# Patient Record
Sex: Male | Born: 1985 | Race: White | Hispanic: No | Marital: Married | Smoking: Former smoker
Health system: Southern US, Community
[De-identification: ages and names within clinical notes are randomized; demographics above are authoritative.]

## PROBLEM LIST (undated history)

## (undated) DIAGNOSIS — R519 Headache, unspecified: Secondary | ICD-10-CM

## (undated) HISTORY — DX: Headache, unspecified: R51.9

---
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
## 2008-05-09 ENCOUNTER — Emergency Department (HOSPITAL_COMMUNITY): Admission: EM | Admit: 2008-05-09 | Discharge: 2008-05-09 | Payer: Self-pay | Admitting: Emergency Medicine

## 2010-10-01 NOTE — Procedures (Signed)
NAME:  Colin Benson, Colin Benson              ACCOUNT NO.:  [id number]   MEDICAL RECORD NO.:  [id number]          PATIENT TYPE:  OUT   LOCATION:  SLEEP CENTER                 FACILITY:  [facility]   PHYSICIAN:  Clinton D. Maple Hudson, M.D. DATE OF BIRTH:  04-02-1986   DATE OF ADMISSION:  12/18/2003  DATE OF DISCHARGE:  12/18/2003                              NOCTURNAL POLYSOMNOGRAM   REFERRING PHYSICIAN:  Meredith Staggers, M.D.   INDICATION FOR STUDY AND HISTORY:  1. Hypersomnia with sleep apnea.  2. Complaints of snoring.  3. Nonrestorative sleep.  4. Excessive daytime somnolence.   Epworth sleepiness score 14/24,  BMI 31, weight 235 pounds.   MEDICATIONS:  Risperdal, Zoloft.   SLEEP ARCHITECTURE:  Total sleep time 351 minutes for sleep efficiency of  92%.  Stage I was 2%; stage II, 78%; Stages III and IV 2%.  REM was 18% of  total sleep time.  Latency to sleep onset 19 minutes, latency to REM 64  minutes.  Awake after sleep onset 5 minutes.  Arousal index 34 times per  hour, mostly associated with respiratory events   RESPIRATORY DATA:  Moderate obstructive sleep apnea/hypopnea syndrome, RDI  29 per hour including 107 obstructive hypopneas, 65 obstructive apneas,  and  1 central apnea.  Events were associated with his habitual supine sleeping  position.  He did not spend significant time in other positions.  Split  study protocol was requested, but he did not generate enough events early  enough in the evening to permit application of the split protocol.  Suggest  return for CPAP titration.   OXYGEN DATA:  Very loud snoring with mild oxygen desaturation to Benson nadir  of83%.  Mean saturation through the night on room air was 97%.   CARDIAC DATA:  Sinus rhythm with no significant ectopic events.   MOVEMENT AND PARASOMNIA:  Occasional leg jerks with arousal, probably  insignificant.   IMPRESSION AND RECOMMENDATIONS:  1. Moderate obstructive sleep apnea/hypopnea syndrome, RDI 29 per hour,  with     desaturation  to 83% and normal     cardiac rhythm.  2. Consider return for CPAP titration or evaluation for alternative     therapies.                                   ______________________________                                Rennis Chris Maple Hudson, M.D.                                Diplomate, American Board of Sleep Medicine    CDY/MEDQ  D:  12/21/2003 13:14:33  T:  12/22/2003 23:50:53  Job:  [id number]

## 2011-02-18 LAB — CBC
MCV: 84.8 fL (ref 78.0–100.0)
Platelets: 385 10*3/uL (ref 150–400)
RDW: 13.1 % (ref 11.5–15.5)
WBC: 11.2 10*3/uL — ABNORMAL HIGH (ref 4.0–10.5)

## 2011-02-18 LAB — URINALYSIS, ROUTINE W REFLEX MICROSCOPIC
Ketones, ur: NEGATIVE mg/dL
Protein, ur: NEGATIVE mg/dL
Urobilinogen, UA: 0.2 mg/dL (ref 0.0–1.0)

## 2011-02-18 LAB — DIFFERENTIAL
Basophils Absolute: 0.1 10*3/uL (ref 0.0–0.1)
Eosinophils Relative: 1 % (ref 0–5)
Lymphocytes Relative: 26 % (ref 12–46)
Monocytes Absolute: 0.8 10*3/uL (ref 0.1–1.0)
Monocytes Relative: 7 % (ref 3–12)
Neutro Abs: 7.3 10*3/uL (ref 1.7–7.7)

## 2011-02-18 LAB — COMPREHENSIVE METABOLIC PANEL
AST: 24 U/L (ref 0–37)
Albumin: 4.1 g/dL (ref 3.5–5.2)
Chloride: 104 mEq/L (ref 96–112)
Creatinine, Ser: 1.08 mg/dL (ref 0.4–1.5)
GFR calc Af Amer: >60 mL/min (ref >60)
Potassium: 3.7 mEq/L (ref 3.5–5.1)
Total Bilirubin: 0.7 mg/dL (ref 0.3–1.2)
Total Protein: 7 g/dL (ref 6.0–8.3)

## 2011-08-08 ENCOUNTER — Ambulatory Visit: Payer: BC Managed Care – PPO | Admitting: Internal Medicine

## 2011-08-08 VITALS — BP 126/85 | HR 59 | Temp 98.5°F | Resp 16 | Ht 77.0 in | Wt 239.8 lb

## 2011-08-08 DIAGNOSIS — L253 Unspecified contact dermatitis due to other chemical products: Secondary | ICD-10-CM

## 2011-08-08 DIAGNOSIS — G4730 Sleep apnea, unspecified: Secondary | ICD-10-CM

## 2011-08-08 DIAGNOSIS — L259 Unspecified contact dermatitis, unspecified cause: Secondary | ICD-10-CM

## 2011-08-08 DIAGNOSIS — Z23 Encounter for immunization: Secondary | ICD-10-CM

## 2011-08-08 MED ORDER — TETANUS-DIPHTH-ACELL PERTUSSIS 5-2.5-18.5 LF-MCG/0.5 IM SUSP: 0.5000 mL | Freq: Once | INTRAMUSCULAR | Status: AC

## 2011-08-08 NOTE — Progress Notes (Signed)
  Subjective:    Patient ID: Colin Benson, male    DOB: 14-Aug-1985, 26 y.o.   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  He has washed the wound thoroughly and there is no pain, drainage, redness, or foreign body sensation.  He can walk without difficulty.  He is unsure of his tetanus status and would like a Tdap today.  He also complains of a slight rash just to the right of his lower lip where he states the condensation from his CPAP machine accumulates during the night.  He has tried Neosporin on the site without improvement or resolution of the redness.    Review of Systems  All other systems reviewed and are negative.       Objective:   Physical Exam  Vitals reviewed. Constitutional: He is oriented to person, place, and time. He appears well-developed and well-nourished.       overweight  HENT:  Head: Normocephalic.  Mouth/Throat: Oropharynx is clear and moist.  Eyes: Conjunctivae are normal.  Neck: Neck supple.  Cardiovascular: Normal rate, regular rhythm and normal heart sounds.   Pulmonary/Chest: Effort normal and breath sounds normal.  Abdominal: Soft.  Neurological: He is alert and oriented to person, place, and time.  Skin:       0.5 cm puncture wound on the bottom of his right foot just below his 4th toe.  No erythema or drainage, soft to touch though skin is think and callused. His right peri-oral area has mild papular lesions with erythema c/w irritant dermatitis  Psychiatric: He has a normal mood and affect. His behavior is normal.          Assessment & Plan:  Tdap for puncture wound which appears to be healing well.  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 Irritant dermatitis likely due to CPAP.  1% Hydrocortison cream over the counter BID for 1-2 weeks THEN STOP CREAM to avoid hyperpigmentation.  Use Vaseline as barrier to skin before bed after erythema and papules are gone.  Pt will  follow-up with [facility] his PCP.

## 2011-08-08 NOTE — Patient Instructions (Signed)
Keep right foot wound clean and dry until skin is well healed.  For the contact dermatitis apply hydrocortisone cream, available over the counter, for 1-2 weeks once or twice daily THEN STOP.  Use Vaseline as a barrier to the skin around your mouth to prevent red rash.  Return to your PCP or to our clinic if it doesn't resolve.

## 2013-07-02 ENCOUNTER — Encounter: Payer: Self-pay | Admitting: Physician Assistant

## 2013-07-02 ENCOUNTER — Ambulatory Visit: Payer: Self-pay

## 2013-07-02 ENCOUNTER — Ambulatory Visit: Payer: 59

## 2013-07-02 ENCOUNTER — Other Ambulatory Visit: Payer: Self-pay | Admitting: Physician Assistant

## 2013-07-02 ENCOUNTER — Ambulatory Visit (INDEPENDENT_AMBULATORY_CARE_PROVIDER_SITE_OTHER): Payer: 59 | Admitting: Family Medicine

## 2013-07-02 VITALS — BP 130/80 | HR 88 | Temp 98.5°F | Resp 16 | Ht 73.0 in | Wt 274.2 lb

## 2013-07-02 DIAGNOSIS — R0789 Other chest pain: Secondary | ICD-10-CM

## 2013-07-02 DIAGNOSIS — S20219A Contusion of unspecified front wall of thorax, initial encounter: Secondary | ICD-10-CM

## 2013-07-02 DIAGNOSIS — R1012 Left upper quadrant pain: Secondary | ICD-10-CM

## 2013-07-02 DIAGNOSIS — R071 Chest pain on breathing: Secondary | ICD-10-CM

## 2013-07-02 LAB — POCT CBC
Granulocyte percent: 65.3 %G (ref 37–80)
HCT, POC: 47.2 % (ref 43.5–53.7)
HEMOGLOBIN: 15.7 g/dL (ref 14.1–18.1)
Lymph, poc: 2.6 (ref 0.6–3.4)
MCH: 29.5 pg (ref 27–31.2)
MCHC: 33.3 g/dL (ref 31.8–35.4)
MCV: 88.7 fL (ref 80–97)
MID (cbc): 0.7 (ref 0–0.9)
MPV: 8.6 fL (ref 0–99.8)
PLATELET COUNT, POC: 390 10*3/uL (ref 142–424)
POC Granulocyte: 6.3 (ref 2–6.9)
POC LYMPH PERCENT: 27.1 %L (ref 10–50)
POC MID %: 7.6 %M (ref 0–12)
RBC: 5.32 M/uL (ref 4.69–6.13)
RDW, POC: 13.6 %
WBC: 9.7 10*3/uL (ref 4.6–10.2)

## 2013-07-02 MED ORDER — HYDROCODONE-ACETAMINOPHEN 5-325 MG PO TABS: 1.0000 | ORAL_TABLET | Freq: Four times a day (QID) | ORAL | Status: DC | PRN

## 2013-07-02 NOTE — Progress Notes (Signed)
Xray read and patient discussed with Eula Listenyan Dunn, PA-C. Agree with assessment and plan of care per his note.   CXR report noted:  FINDINGS:  The cardiac silhouette, mediastinal and hilar contours are normal.  The lungs are clear. No pleural effusion or pneumothorax. The ribs  are intact and the thoracic vertebral bodies appear normal.  IMPRESSION:  No acute cardiopulmonary findings and intact bony thorax

## 2013-07-02 NOTE — Progress Notes (Signed)
Subjective:    Patient ID: Colin Benson, male    DOB: 1986/01/06, 28 y.o.   MRN: [id number]  HPI Primary Physician: Leanor Rubenstein, MD  Chief Complaint: Sledding accident the previous evening  HPI: 28 y.o. male with history below presents with left sided chest wall pain. Patient got a running start and dove head first onto a wooden and metal rail sled. He landed on his chest. The injury occurred around 10 PM on 07/01/13. Upon injuring himself he immediately felt pain along the LUQ and left lower ribs. He states it hurts to take deep breaths. He had a difficult time sleeping the previous night secondary to pain. Pain is worse when laying down. He has taken ibuprofen 600 mg twice and 400 mg once without any relief of pain. No prior chest or abdominal injuries. He has had an inguinal hernia repair at age [age], but no abdominal surgeries.    No past medical history on file.   Home Meds: Prior to Admission medications   Medication Sig Start Date End Date Taking? Authorizing Provider  ibuprofen (ADVIL,MOTRIN) 200 MG tablet Take 200 mg by mouth every 6 (six) hours as needed.   Yes Historical Provider, MD    Allergies:  Allergies  Allergen Reactions  . Azithromycin Swelling     Swelling:  Mouth, throat  . Penicillins Hives and Other (See Comments)    Reaction: fever    History   Social History  . Marital Status: Married    Spouse Name: N/A    Number of Children: N/A  . Years of Education: N/A   Occupational History  . Not on file.   Social History Main Topics  . Smoking status: Former Smoker    Quit date: 08/07/2005  . Smokeless tobacco: Not on file  . Alcohol Use: Not on file  . Drug Use: Not on file  . Sexual Activity: Not on file   Other Topics Concern  . Not on file   Social History Narrative  . No narrative on file     Review of Systems  Constitutional: Negative for diaphoresis.  Respiratory: Negative for cough, shortness of breath and wheezing.     Cardiovascular: Positive for chest pain.  Gastrointestinal: Negative for nausea.  Skin: Negative for color change, pallor, rash and wound.       Negative for ecchymosis.   Neurological: Negative for dizziness, syncope, weakness and light-headedness.       Objective:   Physical Exam  Physical Exam: Blood pressure 130/80, pulse 88, temperature 98.5 F (36.9 C), temperature source Oral, resp. rate 16, height 6\' 1"  (1.854 m), weight 274 lb 3.2 oz (124.376 kg), SpO2 98.00%., Body mass index is 36.18 kg/(m^2). General: Well developed, well nourished, in no acute distress. Head: Normocephalic, atraumatic, eyes without discharge, sclera non-icteric, nares are without discharge.    Neck: Supple. Full ROM.  Lungs: Clear bilaterally to auscultation without wheezes, rales, or rhonchi. Breathing is unlabored. Heart: RRR with S1 S2. No murmurs, rubs, or gallops appreciated. Abdomen: Soft, non-distended with normoactive bowel sounds. TTP LUQ extending superiorly into ribs 11 and 12 and laterally to the midaxillary line. There is no ecchymosis present along the chest wall or abdomen. No hepatosplenomegaly. No rebound/guarding. No obvious abdominal masses. No CVA TTP.  Msk:  Strength and tone normal for age. Extremities/Skin: Warm and dry. No clubbing or cyanosis. No edema. No rashes or suspicious lesions. Neuro: Alert and oriented X 3. Moves all extremities spontaneously. Gait is normal. CNII-XII  grossly in tact. Psych:  Responds to questions appropriately with a normal affect.    3 view CXR:  [facility] reading (PRIMARY) by  Dr. Neva SeatGreene. Negative. No free air seen. No pneumothorax.   Labs: Results for orders placed in visit on 07/02/13  POCT CBC      Result Value Ref Range   WBC 9.7  4.6 - 10.2 K/uL   Lymph, poc 2.6  0.6 - 3.4   POC LYMPH PERCENT 27.1  10 - 50 %L   MID (cbc) 0.7  0 - 0.9   POC MID % 7.6  0 - 12 %M   POC Granulocyte 6.3  2 - 6.9   Granulocyte percent 65.3  37 - 80 %G   RBC 5.32   4.69 - 6.13 M/uL   Hemoglobin 15.7  14.1 - 18.1 g/dL   HCT, POC 16.147.2  09.643.5 - 53.7 %   MCV 88.7  80 - 97 fL   MCH, POC 29.5  27 - 31.2 pg   MCHC 33.3  31.8 - 35.4 g/dL   RDW, POC 04.513.6     Platelet Count, POC 390  142 - 424 K/uL   MPV 8.6  0 - 99.8 fL       Assessment & Plan:  28 year old male with chest wall contusion and LUQ pain -Norco 5/325 mg 1 po q 4-6 hours prn pain #30 no RF, SED -Recheck CBC 07/03/13 in the morning -Declines CT abdomen/pelvis today. Given that his injury occurred 18 hours ago and his hgb is 15.7 I feel this is probably ok at this time. He has been given RTC and ED precautions.  -If his hgb declines or if his pain persists he may need to have the above CT done -Discussed with Dr. Serita GrammesGreene   Jhon Mallozzi, MHS, PA-C [facility] [address] [place], [place] [address] [phone] [facility] 07/02/2013 3:06 PM

## 2013-07-03 ENCOUNTER — Ambulatory Visit (INDEPENDENT_AMBULATORY_CARE_PROVIDER_SITE_OTHER): Payer: 59 | Admitting: Physician Assistant

## 2013-07-03 VITALS — BP 118/78 | HR 71 | Temp 98.6°F | Resp 16 | Ht 73.0 in | Wt 274.0 lb

## 2013-07-03 DIAGNOSIS — R1012 Left upper quadrant pain: Secondary | ICD-10-CM

## 2013-07-03 DIAGNOSIS — R0789 Other chest pain: Secondary | ICD-10-CM

## 2013-07-03 DIAGNOSIS — R071 Chest pain on breathing: Secondary | ICD-10-CM

## 2013-07-03 LAB — POCT CBC
Granulocyte percent: 65.6 %G (ref 37–80)
HEMATOCRIT: 47.1 % (ref 43.5–53.7)
HEMOGLOBIN: 15.7 g/dL (ref 14.1–18.1)
LYMPH, POC: 2.2 (ref 0.6–3.4)
MCH: 29.6 pg (ref 27–31.2)
MCHC: 33.3 g/dL (ref 31.8–35.4)
MCV: 88.8 fL (ref 80–97)
MID (cbc): 0.6 (ref 0–0.9)
MPV: 7.9 fL (ref 0–99.8)
POC Granulocyte: 5.3 (ref 2–6.9)
POC LYMPH PERCENT: 26.7 %L (ref 10–50)
POC MID %: 7.7 % (ref 0–12)
Platelet Count, POC: 369 10*3/uL (ref 142–424)
RBC: 5.3 M/uL (ref 4.69–6.13)
RDW, POC: 13.7 %
WBC: 8.1 10*3/uL (ref 4.6–10.2)

## 2013-07-03 NOTE — Progress Notes (Signed)
   Subjective:    Patient ID: Colin Benson, male    DOB: 11-21-1985, 28 y.o.   MRN: [id number]  HPI   Mr. Colin Benson is a very pleasant 28 yr old male here for follow up on chest wall pain and LUQ pain.  Approx 2 days ago he injured his LUQ/chest while sledding.  Continues to have persistent pain.  Was evaluated here last night.  Some concern for intraabdominal injury given his level of LUQ tenderness.  H/H normal last night.  He is here to repaet that today.  Norco takes the edge of the pain but does not completely resolve.  He is not worsening.    Review of Systems  Constitutional: Negative for fever and chills.  HENT: Negative.   Respiratory: Negative for cough, shortness of breath and wheezing.   Cardiovascular: Positive for chest pain.  Gastrointestinal: Positive for abdominal pain.       Objective:   Physical Exam  Vitals reviewed. Constitutional: He is oriented to person, place, and time. He appears well-developed and well-nourished. No distress.  HENT:  Head: Normocephalic and atraumatic.  Eyes: Conjunctivae are normal. No scleral icterus.  Cardiovascular: Normal rate, regular rhythm and normal heart sounds.   Pulmonary/Chest: Effort normal and breath sounds normal. He has no wheezes. He has no rales.  Abdominal: Soft. There is tenderness in the left upper quadrant.    Neurological: He is alert and oriented to person, place, and time.  Skin: Skin is warm and dry.    Results for orders placed in visit on 07/03/13  POCT CBC      Result Value Ref Range   WBC 8.1  4.6 - 10.2 K/uL   Lymph, poc 2.2  0.6 - 3.4   POC LYMPH PERCENT 26.7  10 - 50 %L   MID (cbc) 0.6  0 - 0.9   POC MID % 7.7  0 - 12 %M   POC Granulocyte 5.3  2 - 6.9   Granulocyte percent 65.6  37 - 80 %G   RBC 5.30  4.69 - 6.13 M/uL   Hemoglobin 15.7  14.1 - 18.1 g/dL   HCT, POC 91.447.1  78.243.5 - 53.7 %   MCV 88.8  80 - 97 fL   MCH, POC 29.6  27 - 31.2 pg   MCHC 33.3  31.8 - 35.4 g/dL   RDW, POC 95.613.7     Platelet Count, POC 369  142 - 424 K/uL   MPV 7.9  0 - 99.8 fL       Assessment & Plan:  LUQ pain - Plan: POCT CBC  Chest wall pain - Plan: POCT CBC   Mr. Colin Benson is a very pleasant 28 yr old male here for f/u on LUQ pain.  He continues to have pain and tenderness in the LUQ and left lower ribs.  No ecchymosis.  H/H unchanged from yesterday, making intraabdominal bleeding less likely.  Continue supportive care and pain medication.  If worsening or not improving, would get CT abd to further visualize any injury.  Pt to call or RTC if worsening or not improving  E. Frances FurbishElizabeth Shamikia Benson MHS, PA-C [facility] 2/18/20153:08 PM

## 2013-07-08 ENCOUNTER — Ambulatory Visit
Admission: RE | Admit: 2013-07-08 | Discharge: 2013-07-08 | Disposition: A | Discharge status: Home / Self Care | Payer: 59 | Source: Ambulatory Visit | Attending: Emergency Medicine | Admitting: Emergency Medicine

## 2013-07-08 ENCOUNTER — Ambulatory Visit (INDEPENDENT_AMBULATORY_CARE_PROVIDER_SITE_OTHER): Payer: 59 | Admitting: Emergency Medicine

## 2013-07-08 VITALS — BP 132/90 | HR 79 | Temp 98.1°F | Resp 16 | Ht 73.0 in | Wt 274.0 lb

## 2013-07-08 DIAGNOSIS — R0789 Other chest pain: Secondary | ICD-10-CM

## 2013-07-08 DIAGNOSIS — R1012 Left upper quadrant pain: Secondary | ICD-10-CM

## 2013-07-08 DIAGNOSIS — S2239XA Fracture of one rib, unspecified side, initial encounter for closed fracture: Secondary | ICD-10-CM

## 2013-07-08 DIAGNOSIS — S20219A Contusion of unspecified front wall of thorax, initial encounter: Secondary | ICD-10-CM

## 2013-07-08 LAB — POCT CBC
GRANULOCYTE PERCENT: 65.3 % (ref 37–80)
HCT, POC: 48.1 % (ref 43.5–53.7)
Hemoglobin: 16 g/dL (ref 14.1–18.1)
Lymph, poc: 2.9 (ref 0.6–3.4)
MCH: 29.7 pg (ref 27–31.2)
MCHC: 33.3 g/dL (ref 31.8–35.4)
MCV: 89.4 fL (ref 80–97)
MID (CBC): 0.7 (ref 0–0.9)
MPV: 8.4 fL (ref 0–99.8)
PLATELET COUNT, POC: 408 10*3/uL (ref 142–424)
POC Granulocyte: 6.8 (ref 2–6.9)
POC LYMPH %: 27.9 % (ref 10–50)
POC MID %: 6.8 % (ref 0–12)
RBC: 5.38 M/uL (ref 4.69–6.13)
RDW, POC: 13.6 %
WBC: 10.4 10*3/uL — AB (ref 4.6–10.2)

## 2013-07-08 MED ORDER — IOHEXOL 300 MG/ML  SOLN
125.0000 mL | Freq: Once | INTRAMUSCULAR | Status: AC | PRN
  Administered 2013-07-08: 125 mL via INTRAVENOUS

## 2013-07-08 MED ORDER — IOHEXOL 300 MG/ML  SOLN
30.0000 mL | Freq: Once | INTRAMUSCULAR | Status: AC | PRN
  Administered 2013-07-08: 30 mL via ORAL

## 2013-07-08 MED ORDER — HYDROCODONE-ACETAMINOPHEN 5-325 MG PO TABS: 1.0000 | ORAL_TABLET | ORAL | Status: DC | PRN

## 2013-07-08 NOTE — Progress Notes (Signed)
[facility] [address], [place] [address] [phone]  Date:  07/08/2013   Name:  Colin LeakDavid A Benson   DOB:  [birth year]   MRN:  [id number]  PCP:  Leanor RubensteinSUN,VYVYAN Y, MD    Chief Complaint: Follow-up   History of Present Illness:  Colin Benson is a 28 y.o. very pleasant male patient who presents with the following:  Injured while sledding a week ago.  Seen here and had labs and xrays.  Apparently the patient refused CT scan.  Now to office with increased LUQ pain and tenderness.  Pain increases with activity, movement and breathing. No nausea or vomiting.  No stool change, cough, wheezing or shortness of breath. No hemoptysis or blood in stools.  No fever or chills. No improvement with over the counter medications or other home remedies. Denies other complaint or health concern today.   Patient Active Problem List   Diagnosis Date Noted  . Sleep apnea 08/08/2011    History reviewed. No pertinent past medical history.  History reviewed. No pertinent past surgical history.  History  Substance Use Topics  . Smoking status: Former Smoker    Quit date: 08/07/2005  . Smokeless tobacco: Not on file  . Alcohol Use: Not on file    History reviewed. No pertinent family history.  Allergies  Allergen Reactions  . Azithromycin Swelling     Swelling:  Mouth, throat  . Penicillins Hives and Other (See Comments)    Reaction: fever    Medication list has been reviewed and updated.  Current Outpatient Prescriptions on File Prior to Visit  Medication Sig Dispense Refill  . HYDROcodone-acetaminophen (NORCO/VICODIN) 5-325 MG per tablet Take 1 tablet by mouth every 6 (six) hours as needed for moderate pain.  30 tablet  0  . ibuprofen (ADVIL,MOTRIN) 200 MG tablet Take 200 mg by mouth every 6 (six) hours as needed.       Current Facility-Administered Medications on File Prior to Visit  Medication Dose Route Frequency Provider Last Rate Last Dose  . TDaP (BOOSTRIX)  injection 0.5 mL  0.5 mL Intramuscular Once Rickard PatienceSarah E Nelson, PA-C        Review of Systems:  As per HPI, otherwise negative.    Physical Examination: Filed Vitals:   07/08/13 1456  BP: 132/90  Pulse: 79  Temp: 98.1 F (36.7 C)  Resp: 16   Filed Vitals:   07/08/13 1456  Height: 6\' 1"  (1.854 m)  Weight: 274 lb (124.286 kg)   Body mass index is 36.16 kg/(m^2). Ideal Body Weight: Weight in (lb) to have BMI = 25: 189.1  GEN: WDWN, NAD, Non-toxic, A & O x 3 HEENT: Atraumatic, Normocephalic. Neck supple. No masses, No LAD. Ears and Nose: No external deformity. CV: RRR, No M/G/R. No JVD. No thrill. No extra heart sounds. PULM: CTA B, no wheezes, crackles, rhonchi. No retractions. No resp. distress. No accessory muscle use. ABD: S, LUQ tenderness, ND, decreased BS. No rebound. No HSM. EXTR: No c/c/e NEURO Normal gait.  PSYCH: Normally interactive. Conversant. Not depressed or anxious appearing.  Calm demeanor.    Assessment and Plan: LUQ tenderness possible spleen injury Fractured left ribs   Signed,  Phillips OdorJeffery Kattaleya Alia, MD

## 2013-07-08 NOTE — Patient Instructions (Signed)
Go to [facility] [address] [phone] for CT abd/pelvis

## 2014-12-19 IMAGING — CT CT ABD-PELV W/ CM
2 of 4 series · 17 of 46 positions shown, 19 images · IV contrast (30CC OMNI 300 & [ID] OMNI 300)
Comparison: DG CHEST 2V dated 07/02/2013; DG CHEST 1V dated
07/02/2013

CLINICAL DATA: Left upper quadrant abdominal pain.  Sliding injury.

EXAM:
CT ABDOMEN AND PELVIS WITH CONTRAST
TECHNIQUE: Multidetector CT imaging of the abdomen and pelvis was performed
using the standard protocol following bolus administration of
intravenous contrast.
CONTRAST:  30mL OMNIPAQUE IOHEXOL 300 MG/ML SOLN, 125mL OMNIPAQUE
IOHEXOL 300 MG/ML SOLN

[Series 2: abd/pelvis with · axial · 0.85mm/px · z∈[-394,+46]mm · 14 of 98 slices shown, 16 images]
[im 5/98  soft-tissue]
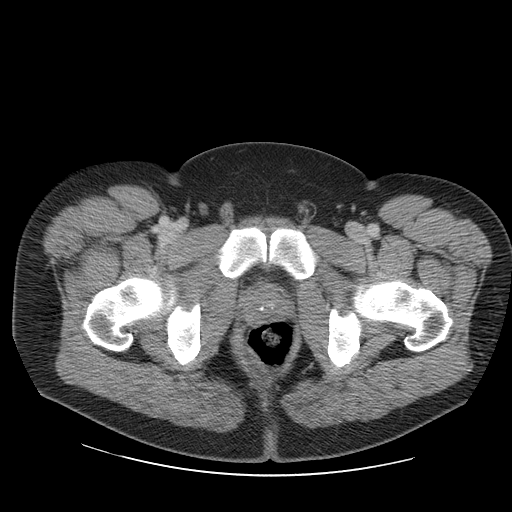
[im 5/98  bone]
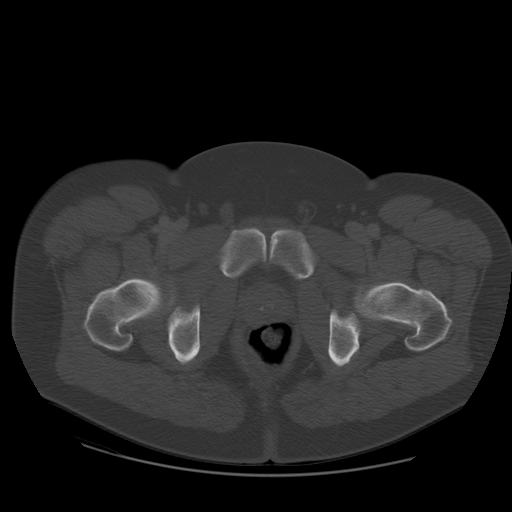
[im 13/98  soft-tissue]
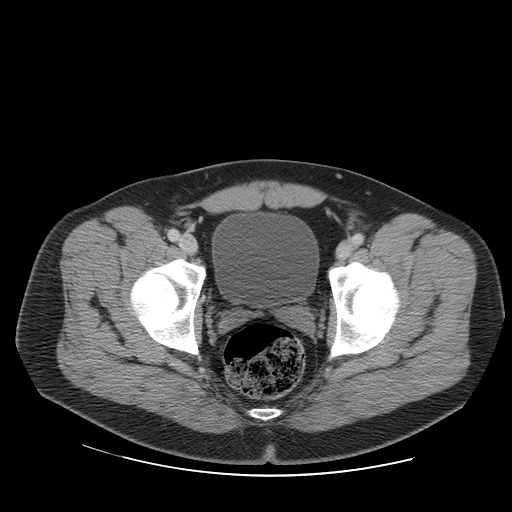
[im 17/98  soft-tissue]
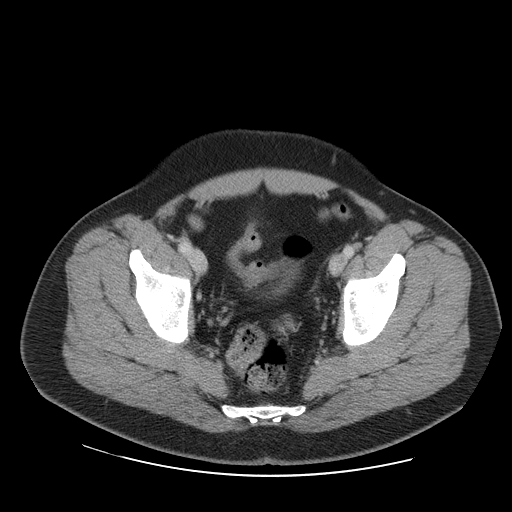
[im 26/98  soft-tissue]
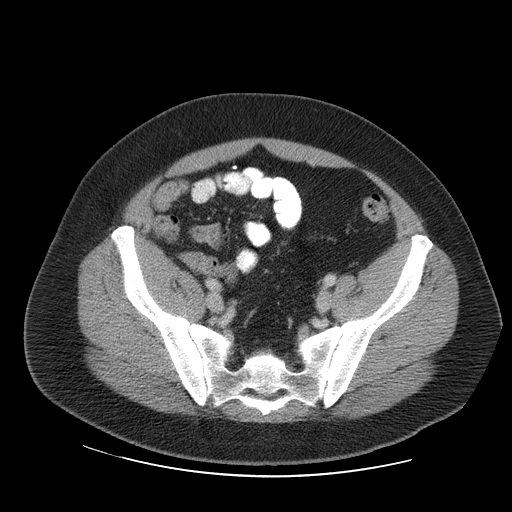
[im 34/98  soft-tissue]
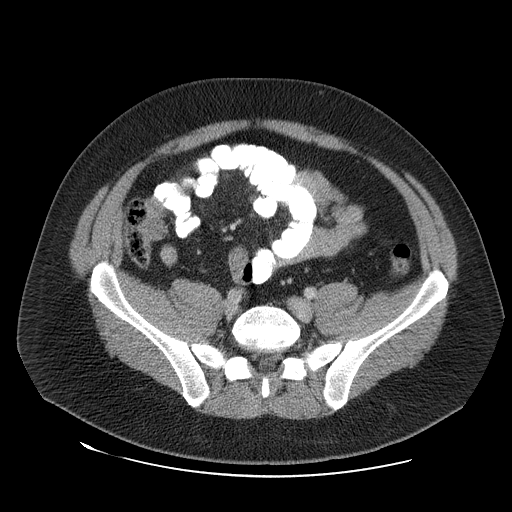
[im 38/98  soft-tissue]
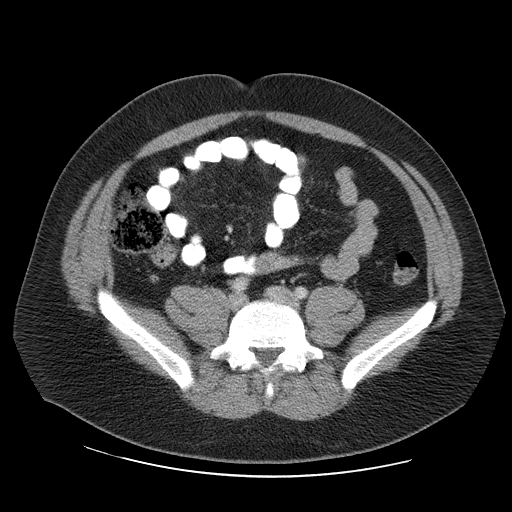
[im 47/98  soft-tissue]
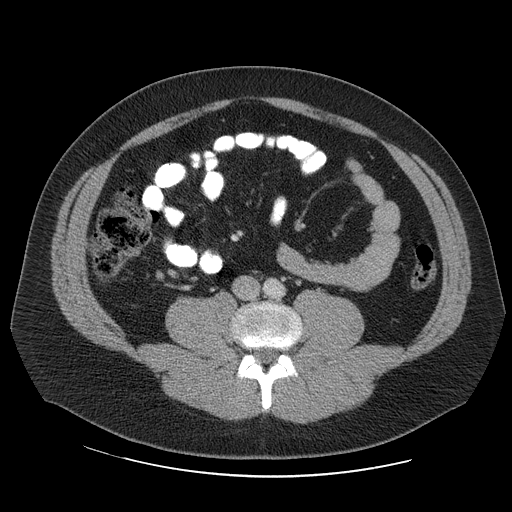
[im 51/98  soft-tissue]
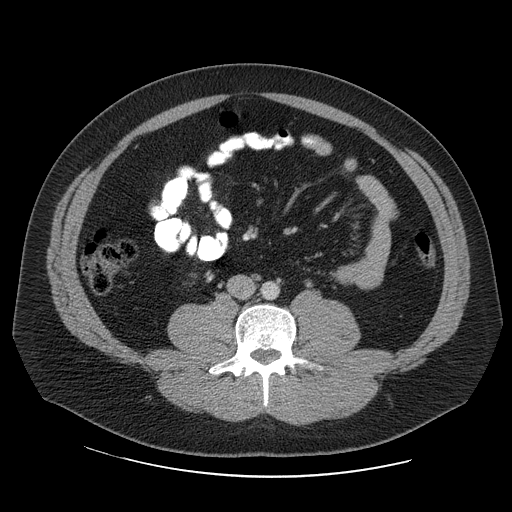
[im 60/98  soft-tissue]
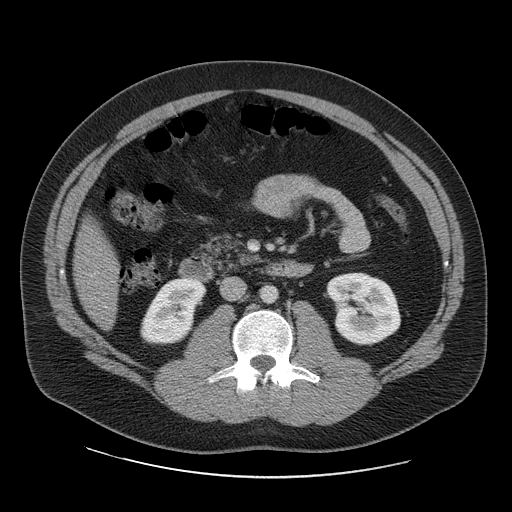
[im 60/98  bone]
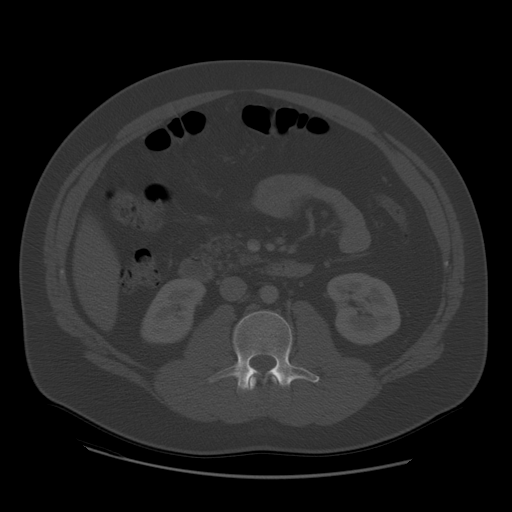
[im 64/98  soft-tissue]
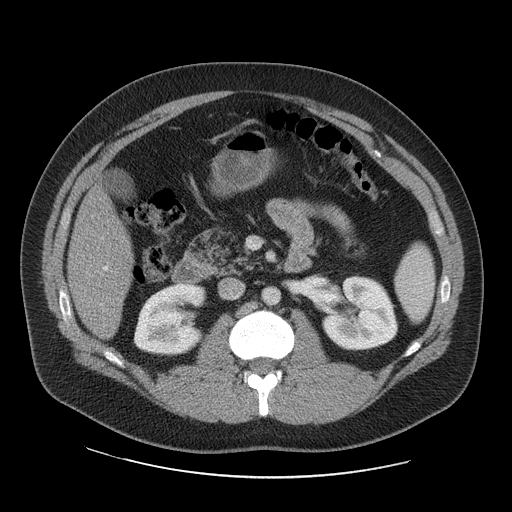
[im 72/98  soft-tissue]
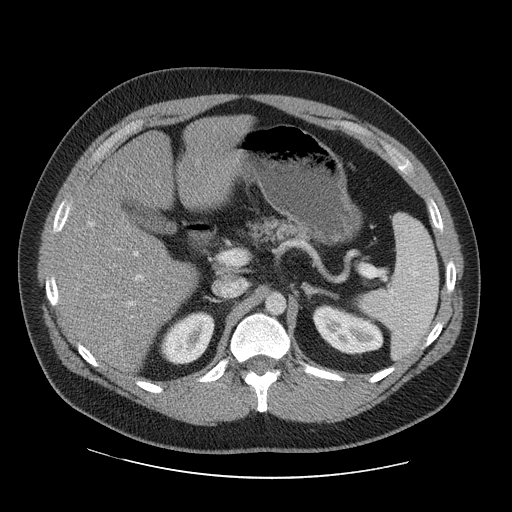
[im 81/98  soft-tissue]
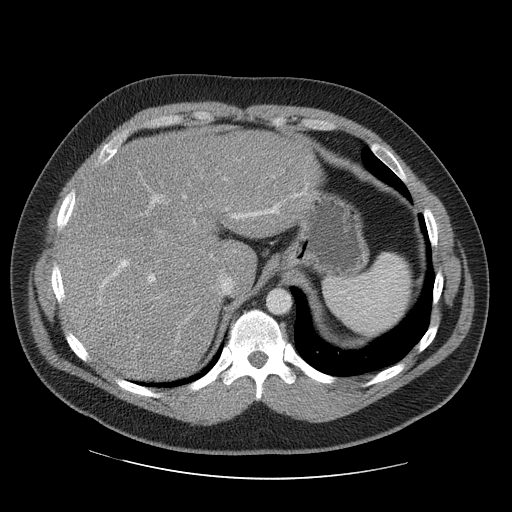
[im 85/98  soft-tissue]
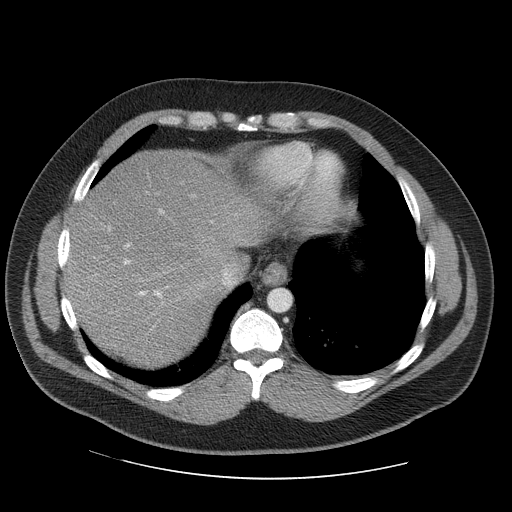
[im 93/98  soft-tissue]
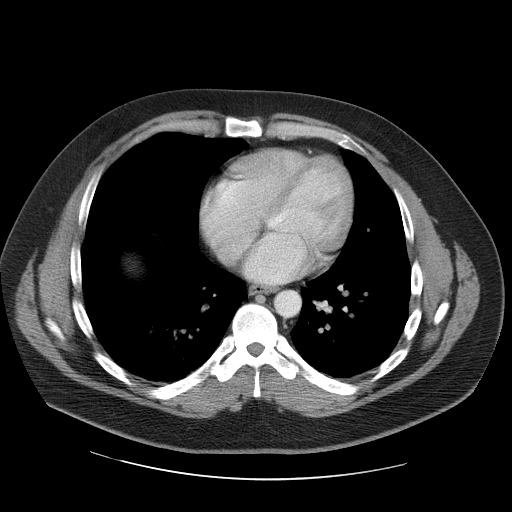

[Series 401: cor · coronal · 1.05mm/px · 3 of 141 slices shown]
[im 47/141  soft-tissue]
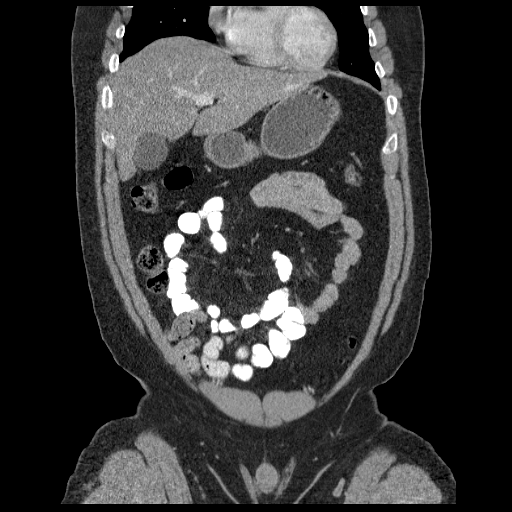
[im 63/141  soft-tissue]
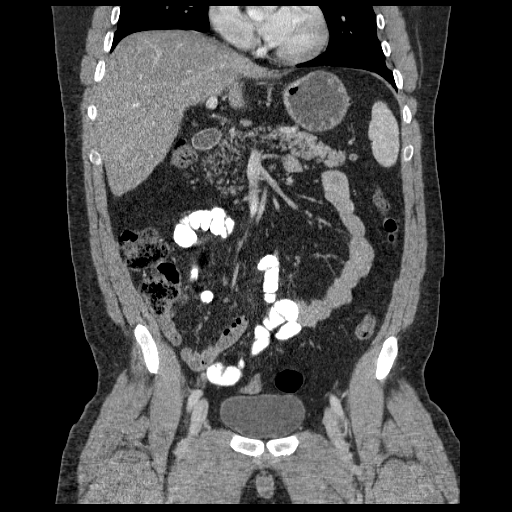
[im 78/141  soft-tissue]
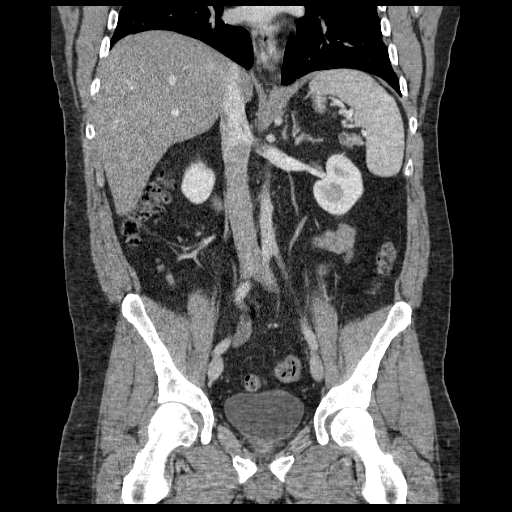

[17 of 46 positions shown; findings below may reference images not displayed]

FINDINGS: Nondisplaced fractures of left seventh and eighth ribs
anterolaterally. No pleural effusion or basilar pneumothorax. Faint
calcification along the left posterior hemidiaphragm, likely
incidental.

No perisplenic ascites or evidence of splenic laceration. Diffuse
hepatic steatosis. There is some fatty replacement in the head and
body of the pancreas.

Adrenal glands and gallbladder unremarkable. Kidneys and proximal
ureters unremarkable. No pathologic upper abdominal adenopathy is
observed. Appendix normal.

Urinary bladder normal. No significant lumbar spine abnormality
observed.
IMPRESSION: 1. Acute nondisplaced fractures left seventh and eighth ribs
anterolaterally.
2. Spleen unremarkable.
3. Diffuse hepatic steatosis. Fatty replacement in the head and body
of the pancreas, likely incidental.

## 2018-02-15 ENCOUNTER — Ambulatory Visit (HOSPITAL_COMMUNITY): Payer: Self-pay | Admitting: Psychiatry

## 2022-07-27 ENCOUNTER — Other Ambulatory Visit: Payer: Self-pay | Admitting: Family Medicine

## 2022-07-27 DIAGNOSIS — G4483 Primary cough headache: Secondary | ICD-10-CM

## 2022-07-29 ENCOUNTER — Encounter: Payer: Self-pay | Admitting: Family Medicine

## 2022-08-01 ENCOUNTER — Ambulatory Visit
Admission: RE | Admit: 2022-08-01 | Discharge: 2022-08-01 | Disposition: A | Discharge status: Home / Self Care | Payer: Medicaid Other | Source: Ambulatory Visit | Attending: Family Medicine | Admitting: Family Medicine

## 2022-08-01 DIAGNOSIS — G4483 Primary cough headache: Secondary | ICD-10-CM

## 2022-08-08 ENCOUNTER — Encounter: Payer: Self-pay | Admitting: Psychiatry

## 2022-08-08 ENCOUNTER — Ambulatory Visit (INDEPENDENT_AMBULATORY_CARE_PROVIDER_SITE_OTHER): Payer: Medicaid Other | Admitting: Psychiatry

## 2022-08-08 VITALS — BP 134/95 | HR 80 | Ht 73.0 in | Wt 233.5 lb

## 2022-08-08 DIAGNOSIS — G4484 Primary exertional headache: Secondary | ICD-10-CM

## 2022-08-08 MED ORDER — PROPRANOLOL HCL 20 MG PO TABS: 20.0000 mg | ORAL_TABLET | Freq: Two times a day (BID) | ORAL | 6 refills | Status: DC

## 2022-08-08 NOTE — Progress Notes (Addendum)
Referring:  Donald Prose, MD [address],  [place] [address]  PCP: Donald Prose, MD  Neurology was asked to evaluate Colin Benson, a 37 year old male for a chief complaint of headaches.  Our recommendations of care will be communicated by shared medical record.    CC:  headaches  History provided from self  HPI:  Medical co-morbidities: single kidney (kidney donor)  The patient presents for evaluation of headaches which began 2 months ago. They are triggered by coughing, sneezing, and bending forward. Feels it has been getting worse over time. He will have an immediate burst of pain which lasts for 1-2 minutes, then has a dull headache which lasts for the rest of the day. Headaches are not associated with photophobia, phonophobia, or nausea.   [facility] 08/01/22 was unremarkable.   Headache History: Onset: 2 months ago Triggers: coughing, sneezing, bending forward Aura: none Location: vertex, forehead Associated Symptoms:  Photophobia: no  Phonophobia: no  Nausea: no Worse with activity?: no Duration of headaches: 1-2 minutes of severe pain followed by several hours of dull headache  Current Treatment: Abortive Tylenol  Preventative none  Prior Therapies                                 Tylenol   LABS: 09/29/21: CMP with Cr 1.34, otherwise wnl  IMAGING:  [facility] 08/01/22: unremarkable  Imaging independently reviewed on August 08, 2022   Current Outpatient Medications on File Prior to Visit  Medication Sig Dispense Refill   amphetamine-dextroamphetamine (ADDERALL XR) 25 MG 24 hr capsule Take 25 mg by mouth every morning.     HYDROcodone-acetaminophen (NORCO/VICODIN) 5-325 MG per tablet Take 1-2 tablets by mouth every 4 (four) hours as needed for moderate pain. (Patient not taking: Reported on 08/08/2022) 30 tablet 0   ibuprofen (ADVIL,MOTRIN) 200 MG tablet Take 200 mg by mouth every 6 (six) hours as needed. (Patient not taking: Reported on 08/08/2022)      Current Facility-Administered Medications on File Prior to Visit  Medication Dose Route Frequency Provider Last Rate Last Admin   TDaP (BOOSTRIX) injection 0.5 mL  0.5 mL Intramuscular Once Kemper Durie, PA-C         Allergies: Allergies  Allergen Reactions   Azithromycin Swelling     Swelling:  Mouth, throat   Penicillins Hives and Other (See Comments)    Reaction: fever    Family History: Migraine or other headaches in the family:  no Aneurysms in a first degree relative:  no  Brain tumors in the family:  no Other neurological illness in the family:   no  Past Medical History: Past Medical History:  Diagnosis Date   Headache     Past Surgical History History reviewed. No pertinent surgical history.  Social History: Social History   Tobacco Use   Smoking status: Former    Types: Cigarettes    Quit date: 08/07/2005    Years since quitting: 17.0  Substance Use Topics   Alcohol use: Yes   Drug use: Never    ROS: Negative for fevers, chills. Positive for headaches. All other systems reviewed and negative unless stated otherwise in HPI.   Physical Exam:   Vital Signs: BP (!) 134/95   Pulse 80   Ht 6\' 1"  (1.854 m)   Wt 233 lb 8 oz (105.9 kg)   BMI 30.81 kg/m  GENERAL: well appearing,in no acute  distress,alert SKIN:  Color, texture, turgor normal. No rashes or lesions HEAD:  Normocephalic/atraumatic. CV:  RRR RESP: Normal respiratory effort MSK: no tenderness to palpation over occiput, neck, or shoulders  NEUROLOGICAL: Mental Status: Alert, oriented to person, place and time,Follows commands Cranial Nerves: PERRL, visual fields intact to confrontation, extraocular movements intact, facial sensation intact, no facial droop or ptosis, hearing grossly intact, no dysarthria Motor: muscle strength 5/5 both upper and lower extremities Reflexes: 2+ throughout Sensation: intact to light touch all 4 extremities Coordination: Finger-to- nose-finger intact  bilaterally Gait: normal-based   IMPRESSION: 37 year old male with a history of single kidney (kidney donor) who presents for evaluation of headaches. Recent CTH was normal. Will order CTA head to rule out vascular causes such as aneurysm or RCVS given exertional and positional nature of his headache. If imaging is normal this likely represents primary cough headache. He would like to start a preventive medication today. Will start propranolol for headache prevention. If imaging is normal will plan to start Imitrex for rescue. He cannot take NSAIDs due to his history of being a kidney donor.  PLAN: -CTA head -Start propranolol 20 mg BID for headache prevention -If CTA is normal, will send in Imitrex for headache rescue -Next steps: consider MRI brain if headaches persist/worsen despite treatment   I spent a total of 22 minutes chart reviewing and counseling the patient. Headache education was done. Discussed treatment options including preventive and acute medications. Discussed medication side effects, adverse reactions and drug interactions. Written educational materials and patient instructions outlining all of the above were given.  Follow-up: 6 months   Genia Harold, MD 08/08/2022   1:29 PM

## 2022-08-08 NOTE — Patient Instructions (Signed)
Start propranolol 20 mg twice a day for headache prevention  CT angiogram (CT scan of the blood vessels in the head) to rule out aneurysm. If this scan is normal I will send in another headache medication to take as-needed at the onset of your headache

## 2022-08-10 ENCOUNTER — Telehealth: Payer: Self-pay | Admitting: Psychiatry

## 2022-08-10 NOTE — Telephone Encounter (Signed)
[employer] medicaid Josem KaufmannMJ:[id number] exp. 08/10/22-09/24/22 sent to GI [phone]

## 2022-08-12 ENCOUNTER — Encounter (INDEPENDENT_AMBULATORY_CARE_PROVIDER_SITE_OTHER): Payer: Medicaid Other | Admitting: Psychiatry

## 2022-08-12 DIAGNOSIS — G4484 Primary exertional headache: Secondary | ICD-10-CM

## 2022-08-12 DIAGNOSIS — R519 Headache, unspecified: Secondary | ICD-10-CM

## 2022-08-15 MED ORDER — CYCLOBENZAPRINE HCL 10 MG PO TABS: 10.0000 mg | ORAL_TABLET | Freq: Three times a day (TID) | ORAL | 0 refills | Status: AC | PRN

## 2022-08-15 NOTE — Telephone Encounter (Signed)

## 2022-08-18 ENCOUNTER — Ambulatory Visit
Admission: RE | Admit: 2022-08-18 | Discharge: 2022-08-18 | Disposition: A | Discharge status: Home / Self Care | Payer: Medicaid Other | Source: Ambulatory Visit | Attending: Psychiatry | Admitting: Psychiatry

## 2022-08-18 DIAGNOSIS — G4484 Primary exertional headache: Secondary | ICD-10-CM

## 2022-08-18 MED ORDER — IOPAMIDOL (ISOVUE-370) INJECTION 76%
75.0000 mL | Freq: Once | INTRAVENOUS | Status: AC | PRN
  Administered 2022-08-18: 75 mL via INTRAVENOUS

## 2022-08-22 ENCOUNTER — Other Ambulatory Visit: Payer: Self-pay | Admitting: Psychiatry

## 2022-08-22 MED ORDER — SUMATRIPTAN SUCCINATE 50 MG PO TABS: 50.0000 mg | ORAL_TABLET | ORAL | 0 refills | Status: DC | PRN

## 2022-08-24 ENCOUNTER — Other Ambulatory Visit: Payer: Medicaid Other

## 2022-09-08 NOTE — Telephone Encounter (Signed)
Please have patient increase the propanolol to 40 mg twice daily. Please advise patient to monitor side effect mainly dizziness and feeling off balance. If that happens, please decrease dose back to 20 and call us.

## 2022-09-16 NOTE — Telephone Encounter (Signed)
Please see the MyChart message reply(ies) for my assessment and plan.  ?  ?This patient gave consent for this Medical Advice Message and is aware that it may result in a bill to their insurance company, as well as the possibility of receiving a bill for a co-payment or deductible. They are an established patient, but are not seeking medical advice exclusively about a problem treated during an in person or video visit in the last seven days. I did not recommend an in person or video visit within seven days of my reply.  ?  ?I spent a total of 6 minutes cumulative time within 7 days through MyChart messaging. ? ?Taiwana Willison, MD   ?

## 2022-09-16 NOTE — Addendum Note (Signed)
Addended byWindell Norfolk on: 09/16/2022 01:10 PM   Modules accepted: Orders

## 2022-09-16 NOTE — Telephone Encounter (Signed)
Please advise patient to continue with Imitrex and Aleve/Ibuprofen. We will order a MRI of his brain.

## 2022-09-19 ENCOUNTER — Encounter: Payer: Self-pay | Admitting: Adult Health

## 2022-09-26 ENCOUNTER — Telehealth: Payer: Self-pay | Admitting: Neurology

## 2022-09-26 NOTE — Telephone Encounter (Signed)
Mychart msg sent

## 2022-09-26 NOTE — Telephone Encounter (Signed)
MRI denied, his CT did not show any acute abnormality. We will continue to treat the headaches medically.

## 2022-09-26 NOTE — Telephone Encounter (Signed)
[facility] medicaid sent a denial for the MRI. I will put the denial letter with appeal information in the pod. Case #[id number]

## 2022-10-11 ENCOUNTER — Other Ambulatory Visit: Payer: Self-pay | Admitting: Neurology

## 2022-10-11 MED ORDER — PROPRANOLOL HCL 40 MG PO TABS: 40.0000 mg | ORAL_TABLET | Freq: Two times a day (BID) | ORAL | 3 refills | Status: DC

## 2022-10-11 NOTE — Addendum Note (Signed)
Addended by: Judi Cong on: 10/11/2022 01:26 PM   Modules accepted: Orders

## 2022-10-11 NOTE — Telephone Encounter (Signed)
No plan to appeal the denial, His head CT was normal

## 2022-11-09 ENCOUNTER — Telehealth: Payer: Self-pay | Admitting: Psychiatry

## 2022-11-09 NOTE — Telephone Encounter (Signed)
..   Pt understands that although there may be some limitations with this type of visit, we will take all precautions to reduce any security or privacy concerns.  Pt understands that this will be treated like an in office visit and we will file with pt's insurance, and there may be a patient responsible charge related to this service. ? ?

## 2022-11-09 NOTE — Telephone Encounter (Signed)
Called pt to further discuss. LVM for him to call office. If he is having worsening headaches, ok to schedule sooner appt with any NP.

## 2022-11-09 NOTE — Telephone Encounter (Signed)
Pt has been scheduled a my chart vv with Aundra Millet, NP at 10:45am, this is FYI to POD 1

## 2022-11-09 NOTE — Telephone Encounter (Signed)
Pt is asking to be seen earlier due to worsening symptoms [id number], please review for consideration of being seen earlier.

## 2022-11-10 ENCOUNTER — Telehealth (INDEPENDENT_AMBULATORY_CARE_PROVIDER_SITE_OTHER): Payer: Medicaid Other | Admitting: Adult Health

## 2022-11-10 DIAGNOSIS — G8929 Other chronic pain: Secondary | ICD-10-CM

## 2022-11-10 DIAGNOSIS — R519 Headache, unspecified: Secondary | ICD-10-CM | POA: Diagnosis not present

## 2022-11-10 DIAGNOSIS — G4484 Primary exertional headache: Secondary | ICD-10-CM

## 2022-11-10 MED ORDER — TOPIRAMATE 25 MG PO TABS: 25.0000 mg | ORAL_TABLET | Freq: Every day | ORAL | 5 refills | Status: DC

## 2022-11-10 MED ORDER — SUMATRIPTAN SUCCINATE 50 MG PO TABS: 50.0000 mg | ORAL_TABLET | ORAL | 11 refills | Status: DC | PRN

## 2022-11-10 NOTE — Progress Notes (Addendum)
PATIENT: Colin Benson DOB: 03-26-1986  REASON FOR VISIT: follow up HISTORY FROM: patient  Virtual Visit via Video Note  I connected with Colin Benson on 11/10/22 at 10:45 AM EDT by a video enabled telemedicine application located remotely at [facility] and verified that I am speaking with the correct person using two identifiers who was located at their own home.   I discussed the limitations of evaluation and management by telemedicine and the availability of in person appointments. The patient expressed understanding and agreed to proceed.   PATIENT: Colin Benson DOB: 30-Jun-1985  REASON FOR VISIT: follow up HISTORY FROM: patient  HISTORY OF PRESENT ILLNESS: Today 11/10/22:  Colin Benson is a 37 y.o. male with a history of exertional headaches. Returns today for follow-up.  He reports that his headaches have worsened and he has developed a different type of headache.  Initially was getting headaches only when he coughs or sneezed. However, now he is getting a headache without those triggers. Cough related headaches are in the top center of the head.now will get a stabbing headache between the eyes.these headaches can last for hours.  He states that if they are severe he will try Imitrex but not sure how much it helps.  He does state that Imitrex makes him feel a little weird.  He also reports that he will get headaches in the temporal region.  These feel like a  burning sensation. These headaches only last for minutes.  An MRI was ordered but this was denied by his insurance company.  He had a CTA and a CT scan of the head that was unremarkable.  His headaches are typically not associated with nausea or vomiting.  He does report some phonophobia with his headaches.  He denies lacrimation or nasal congestion with his headaches.  HPI:  Medical co-morbidities: single kidney (kidney donor)   The patient presents for evaluation of headaches which began 2  months ago. They are triggered by coughing, sneezing, and bending forward. Feels it has been getting worse over time. He will have an immediate burst of pain which lasts for 1-2 minutes, then has a dull headache which lasts for the rest of the day. Headaches are not associated with photophobia, phonophobia, or nausea.    [facility] 08/01/22 was unremarkable.    Headache History: Onset: 2 months ago Triggers: coughing, sneezing, bending forward Aura: none Location: vertex, forehead Associated Symptoms:             Photophobia: no             Phonophobia: no             Nausea: no Worse with activity?: no Duration of headaches: 1-2 minutes of severe pain followed by several hours of dull headache   Current Treatment: Abortive Tylenol   Preventative none   Prior Therapies                                 Tylenol     LABS: 09/29/21: CMP with Cr 1.34, otherwise wnl   IMAGING:  [facility] 08/01/22: unremarkable   REVIEW OF SYSTEMS: Out of a complete 14 system review of symptoms, the patient complains only of the following symptoms, and all other reviewed systems are negative.  ALLERGIES: Allergies  Allergen Reactions   Azithromycin Swelling     Swelling:  Mouth, throat   Penicillins Hives and  Other (See Comments)    Reaction: fever    HOME MEDICATIONS: Outpatient Medications Prior to Visit  Medication Sig Dispense Refill   amphetamine-dextroamphetamine (ADDERALL XR) 25 MG 24 hr capsule Take 25 mg by mouth every morning.     HYDROcodone-acetaminophen (NORCO/VICODIN) 5-325 MG per tablet Take 1-2 tablets by mouth every 4 (four) hours as needed for moderate pain. (Patient not taking: Reported on 08/08/2022) 30 tablet 0   ibuprofen (ADVIL,MOTRIN) 200 MG tablet Take 200 mg by mouth every 6 (six) hours as needed. (Patient not taking: Reported on 08/08/2022)     propranolol (INDERAL) 40 MG tablet Take 1 tablet (40 mg total) by mouth 2 (two) times daily. 60 tablet 3   SUMAtriptan (IMITREX) 50 MG  tablet Take 1 tablet (50 mg total) by mouth as needed for headache. May repeat in 2 hours if headache persists or recurs. Max dose 2 pills in 24 hours 10 tablet 0   Facility-Administered Medications Prior to Visit  Medication Dose Route Frequency Provider Last Rate Last Admin   TDaP (BOOSTRIX) injection 0.5 mL  0.5 mL Intramuscular Once Starla Link        PAST MEDICAL HISTORY: Past Medical History:  Diagnosis Date   Headache     PAST SURGICAL HISTORY: No past surgical history on file.  FAMILY HISTORY: No family history on file.  SOCIAL HISTORY: Social History   Socioeconomic History   Marital status: Married    Spouse name: Not on file   Number of children: Not on file   Years of education: Not on file   Highest education level: Not on file  Occupational History   Not on file  Tobacco Use   Smoking status: Former    Types: Cigarettes    Quit date: 08/07/2005    Years since quitting: 17.2   Smokeless tobacco: Not on file  Substance and Sexual Activity   Alcohol use: Yes   Drug use: Never   Sexual activity: Not on file  Other Topics Concern   Not on file  Social History Narrative   Right Handed   2 cups of Caffeine per day   Social Determinants of Health   Financial Resource Strain: Not on file  Food Insecurity: Not on file  Transportation Needs: Not on file  Physical Activity: Not on file  Stress: Not on file  Social Connections: Not on file  Intimate Partner Violence: Not on file      PHYSICAL EXAM Generalized: Well developed, in no acute distress   Neurological examination  Mentation: Alert oriented to time, place, history taking. Follows all commands speech and language fluent Cranial nerve II-XII: Facial symmetry noted  DIAGNOSTIC DATA (LABS, IMAGING, TESTING) - I reviewed patient records, labs, notes, testing and imaging myself where available.  Lab Results  Component Value Date   WBC 10.4 (A) 07/08/2013   HGB 16.0 07/08/2013   HCT  48.1 07/08/2013   MCV 89.4 07/08/2013   PLT 385 05/09/2008      Component Value Date/Time   NA 138 05/09/2008 0720   K 3.7 05/09/2008 0720   CL 104 05/09/2008 0720   CO2 27 05/09/2008 0720   GLUCOSE 118 (H) 05/09/2008 0720   BUN 19 05/09/2008 0720   CREATININE 1.08 05/09/2008 0720   CALCIUM 9.5 05/09/2008 0720   PROT 7.0 05/09/2008 0720   ALBUMIN 4.1 05/09/2008 0720   AST 24 05/09/2008 0720   ALT 30 05/09/2008 0720   ALKPHOS 77 05/09/2008 0720  BILITOT 0.7 05/09/2008 0720   GFRNONAA >60 05/09/2008 0720   GFRAA  05/09/2008 0720    >60        The eGFR has been calculated using the MDRD equation. This calculation has not been validated in all clinical     ASSESSMENT AND PLAN 37 y.o. year old male  has a past medical history of Headache. here with:  1.  Exertional headache 2.  Chronic headache vs. Primary stabbing headache  -Continue propranolol 40 mg twice a day -Start Topamax 25 mg at bedtime.  I reviewed side effects with the patient and provided him information on his after visit summary. -If this is not beneficial the dose may need to be increased to 50 mg at bedtime. -His MRI was denied by his insurance.  If his headaches do not improve with Topamax MRI may need to be reconsidered. -He was advised to use Imitrex at the onset of a migraine.  If needed he can cut the dose in half to see if he tolerates it better.  If he is unable to tolerate side effects we will need to change to a different medication. - FU in 3-4 months       Butch Penny, MSN, NP-C 11/10/2022, 10:58 AM [facility] [address], [address] [place], [place] [address] [phone]

## 2022-11-10 NOTE — Patient Instructions (Addendum)
Your Plan:  Continue propranolol Start Topamax 25 mg at bedtime Continue imitrex-can try cutting the dose in half to see if it is tolerated better.  If you still have trouble tolerating please let me know and we can consider a different medication If no improvement in headache frequency please let us know Follow-up in 6 months or sooner if needed      Thank you for coming to see [place] at [facility]. I hope we have been able to provide you high quality care today.  You may receive a patient satisfaction survey over the next few weeks. We would appreciate your feedback and comments so that we may continue to improve ourselves and the health of our patients.

## 2022-11-16 NOTE — Telephone Encounter (Signed)
Colin Benson here is the MRI information.

## 2022-12-26 ENCOUNTER — Other Ambulatory Visit: Payer: Self-pay | Admitting: Psychiatry

## 2022-12-26 NOTE — Telephone Encounter (Signed)
Last seen on 11/10/22 per note "Continue propranolol 40 mg twice a day " Follow up scheduled on 01/17/23

## 2023-01-16 NOTE — Progress Notes (Unsigned)
PATIENT: Colin Benson DOB: 05/13/1986  REASON FOR VISIT: follow up HISTORY FROM: patient PRIMARY NEUROLOGIST: Dr. Delena Bali  HISTORY OF PRESENT ILLNESS: Today 01/16/23  Colin Benson is a 37 y.o. male who has been followed in this office for exertional headaches. Returns today for follow-up.  At the last visit he was still having a cough or sneeze associated headaches but then had also developed migraine type headache.  Cough or sneeze- usually goes away quickly but if its multiple coughs or sneezes then headache will last longer.  Intractable headache: has a headache 2-3 times a week. Was daily. If severe will take imitrex. If not severe will usually resolve in a few hours.  Headache is typically located in the center of the forehead.  Reports photophobia but denies phonophobia.  Denies nausea or vomiting.  He is taking Topamax 25 mg at bedtime he does state that it causes tingling in the fingertips and carbonated beverages taste flat.  He is tolerating side effects okay for now.  Patient only has 1 kidney.  Not a good candidate for NSAIDs  HISTORY:   11/10/22:   Colin Benson is a 37 y.o. male with a history of exertional headaches. Returns today for follow-up.  He reports that his headaches have worsened and he has developed a different type of headache.  Initially was getting headaches only when he coughs or sneezed. However, now he is getting a headache without those triggers. Cough related headaches are in the top center of the head.now will get a stabbing headache between the eyes.these headaches can last for hours.  He states that if they are severe he will try Imitrex but not sure how much it helps.  He does state that Imitrex makes him feel a little weird.  He also reports that he will get headaches in the temporal region.  These feel like a  burning sensation. These headaches only last for minutes.  An MRI was ordered but this was denied by his insurance company.  He had a CTA  and a CT scan of the head that was unremarkable.  His headaches are typically not associated with nausea or vomiting.  He does report some phonophobia with his headaches.  He denies lacrimation or nasal congestion with his headaches.   HPI:  Medical co-morbidities: single kidney (kidney donor)   The patient presents for evaluation of headaches which began 2 months ago. They are triggered by coughing, sneezing, and bending forward. Feels it has been getting worse over time. He will have an immediate burst of pain which lasts for 1-2 minutes, then has a dull headache which lasts for the rest of the day. Headaches are not associated with photophobia, phonophobia, or nausea.    [facility] 08/01/22 was unremarkable.    Headache History: Onset: 2 months ago Triggers: coughing, sneezing, bending forward Aura: none Location: vertex, forehead Associated Symptoms:             Photophobia: no             Phonophobia: no             Nausea: no Worse with activity?: no Duration of headaches: 1-2 minutes of severe pain followed by several hours of dull headache   Current Treatment: Abortive Tylenol   Preventative none   Prior Therapies  Tylenol     LABS: 09/29/21: CMP with Cr 1.34, otherwise wnl   IMAGING:  [facility] 08/01/22: unremarkable    REVIEW OF SYSTEMS: Out of a complete 14 system review of symptoms, the patient complains only of the following symptoms, and all other reviewed systems are negative.  ALLERGIES: Allergies  Allergen Reactions   Azithromycin Swelling     Swelling:  Mouth, throat   Penicillins Hives and Other (See Comments)    Reaction: fever    HOME MEDICATIONS: Outpatient Medications Prior to Visit  Medication Sig Dispense Refill   amphetamine-dextroamphetamine (ADDERALL XR) 25 MG 24 hr capsule Take 25 mg by mouth every morning.     HYDROcodone-acetaminophen (NORCO/VICODIN) 5-325 MG per tablet Take 1-2 tablets by mouth every 4 (four) hours  as needed for moderate pain. (Patient not taking: Reported on 08/08/2022) 30 tablet 0   ibuprofen (ADVIL,MOTRIN) 200 MG tablet Take 200 mg by mouth every 6 (six) hours as needed. (Patient not taking: Reported on 08/08/2022)     propranolol (INDERAL) 40 MG tablet TAKE 1 TABLET BY MOUTH TWICE A DAY 180 tablet 0   SUMAtriptan (IMITREX) 50 MG tablet Take 1 tablet (50 mg total) by mouth as needed for headache. May repeat in 2 hours if headache persists or recurs. Max dose 2 pills in 24 hours 10 tablet 11   topiramate (TOPAMAX) 25 MG tablet Take 1 tablet (25 mg total) by mouth at bedtime. 30 tablet 5   Facility-Administered Medications Prior to Visit  Medication Dose Route Frequency Provider Last Rate Last Admin   TDaP (BOOSTRIX) injection 0.5 mL  0.5 mL Intramuscular Once Starla Link        PAST MEDICAL HISTORY: Past Medical History:  Diagnosis Date   Headache     PAST SURGICAL HISTORY: No past surgical history on file.  FAMILY HISTORY: No family history on file.  SOCIAL HISTORY: Social History   Socioeconomic History   Marital status: Married    Spouse name: Not on file   Number of children: Not on file   Years of education: Not on file   Highest education level: Not on file  Occupational History   Not on file  Tobacco Use   Smoking status: Former    Current packs/day: 0.00    Types: Cigarettes    Quit date: 08/07/2005    Years since quitting: 17.4   Smokeless tobacco: Not on file  Substance and Sexual Activity   Alcohol use: Yes   Drug use: Never   Sexual activity: Not on file  Other Topics Concern   Not on file  Social History Narrative   Right Handed   2 cups of Caffeine per day   Social Determinants of Health   Financial Resource Strain: Not on file  Food Insecurity: Not on file  Transportation Needs: Not on file  Physical Activity: Not on file  Stress: Not on file  Social Connections: Not on file  Intimate Partner Violence: Not on file       PHYSICAL EXAM  Vitals:   01/17/23 1331  BP: 116/77  Pulse: (!) 55  Weight: 228 lb (103.4 kg)  Height: 6\' 1"  (1.854 m)   Body mass index is 30.08 kg/m.  Generalized: Well developed, in no acute distress   Neurological examination  Mentation: Alert oriented to time, place, history taking. Follows all commands speech and language fluent Cranial nerve II-XII: Pupils were equal round reactive to light. Extraocular movements were full, visual field were full  on confrontational test. Facial sensation and strength were normal. Uvula tongue midline. Head turning and shoulder shrug  were normal and symmetric. Motor: The motor testing reveals 5 over 5 strength of all 4 extremities. Good symmetric motor tone is noted throughout.  Sensory: Sensory testing is intact to soft touch on all 4 extremities. No evidence of extinction is noted.  Coordination: Cerebellar testing reveals good finger-nose-finger and heel-to-shin bilaterally.  Gait and station: Gait is normal. Tandem gait is normal. Romberg is negative. No drift is seen.  Reflexes: Deep tendon reflexes are symmetric and normal bilaterally.   DIAGNOSTIC DATA (LABS, IMAGING, TESTING) - I reviewed patient records, labs, notes, testing and imaging myself where available.  Lab Results  Component Value Date   WBC 10.4 (A) 07/08/2013   HGB 16.0 07/08/2013   HCT 48.1 07/08/2013   MCV 89.4 07/08/2013   PLT 385 05/09/2008      Component Value Date/Time   NA 138 05/09/2008 0720   K 3.7 05/09/2008 0720   CL 104 05/09/2008 0720   CO2 27 05/09/2008 0720   GLUCOSE 118 (H) 05/09/2008 0720   BUN 19 05/09/2008 0720   CREATININE 1.08 05/09/2008 0720   CALCIUM 9.5 05/09/2008 0720   PROT 7.0 05/09/2008 0720   ALBUMIN 4.1 05/09/2008 0720   AST 24 05/09/2008 0720   ALT 30 05/09/2008 0720   ALKPHOS 77 05/09/2008 0720   BILITOT 0.7 05/09/2008 0720   GFRNONAA >60 05/09/2008 0720   GFRAA  05/09/2008 0720    >60        The eGFR has been  calculated using the MDRD equation. This calculation has not been validated in all clinical       ASSESSMENT AND PLAN 37 y.o. year old male  has a past medical history of Headache. here with:  Intractable headache- Migraine? Primary cough headache  -Continue on propranolol 40 mg twice a day -Increase Topamax to 50 mg at bedtime -Continue Imitrex for abortive therapy -If the patient's headaches do not continue to improve may consider MRI in the future -Follow-up in 6 months or sooner if needed       Colin Penny, MSN, NP-C 01/16/2023, 10:54 AM [facility] [address], [address] [place], [place] [address] [phone]

## 2023-01-17 ENCOUNTER — Ambulatory Visit (INDEPENDENT_AMBULATORY_CARE_PROVIDER_SITE_OTHER): Payer: Medicaid Other | Admitting: Adult Health

## 2023-01-17 ENCOUNTER — Encounter: Payer: Self-pay | Admitting: Adult Health

## 2023-01-17 VITALS — BP 116/77 | HR 55 | Ht 73.0 in | Wt 228.0 lb

## 2023-01-17 DIAGNOSIS — R519 Headache, unspecified: Secondary | ICD-10-CM

## 2023-01-17 DIAGNOSIS — G8929 Other chronic pain: Secondary | ICD-10-CM | POA: Diagnosis not present

## 2023-01-17 DIAGNOSIS — G4483 Primary cough headache: Secondary | ICD-10-CM

## 2023-01-17 MED ORDER — TOPIRAMATE 50 MG PO TABS: 50.0000 mg | ORAL_TABLET | Freq: Every day | ORAL | 11 refills | Status: DC

## 2023-01-17 NOTE — Patient Instructions (Signed)
Your Plan:  Increase Topamax to 50 mg at bedtime Continue Propranolol 40 mg BID If your symptoms worsen or you develop new symptoms please let us know.    Thank you for coming to see [place] at [facility]. I hope we have been able to provide you high quality care today.  You may receive a patient satisfaction survey over the next few weeks. We would appreciate your feedback and comments so that we may continue to improve ourselves and the health of our patients.

## 2023-02-06 ENCOUNTER — Ambulatory Visit: Payer: Medicaid Other | Admitting: Adult Health

## 2023-05-01 ENCOUNTER — Other Ambulatory Visit (HOSPITAL_BASED_OUTPATIENT_CLINIC_OR_DEPARTMENT_OTHER): Payer: Self-pay

## 2023-05-01 MED ORDER — INFLUENZA VIRUS VACC SPLIT PF (FLUZONE) 0.5 ML IM SUSY
0.5000 mL | PREFILLED_SYRINGE | Freq: Once | INTRAMUSCULAR | 0 refills | Status: AC
  Filled 2023-05-01: qty 0.5, 1d supply, fill #0

## 2023-05-01 MED ORDER — COVID-19 MRNA VAC-TRIS(PFIZER) 30 MCG/0.3ML IM SUSY
0.3000 mL | PREFILLED_SYRINGE | Freq: Once | INTRAMUSCULAR | 0 refills | Status: AC
  Filled 2023-05-01: qty 0.3, 1d supply, fill #0

## 2023-09-14 ENCOUNTER — Encounter: Payer: Self-pay | Admitting: Adult Health

## 2023-09-14 ENCOUNTER — Ambulatory Visit: Payer: Medicaid Other | Admitting: Adult Health

## 2023-09-14 VITALS — BP 125/84 | HR 74 | Ht 73.0 in | Wt 235.0 lb

## 2023-09-14 DIAGNOSIS — G4483 Primary cough headache: Secondary | ICD-10-CM

## 2023-09-14 DIAGNOSIS — G4484 Primary exertional headache: Secondary | ICD-10-CM | POA: Diagnosis not present

## 2023-09-14 NOTE — Progress Notes (Signed)
PATIENT: Colin Benson DOB: [birth year]  REASON FOR VISIT: follow up HISTORY FROM: patient PRIMARY NEUROLOGIST: Dr. Billy Bue  Chief Complaint  Patient presents with   RM 4    Patient is here alone for headache follow-up. He states the headaches have essentially been 100% better. He stopped taking Propranolol  and Topamax . He hasn't taken Imitrex  in about 8 months. He states the headaches came out of nowhere, lasted a year, then went away.      HISTORY OF PRESENT ILLNESS: Today 09/14/23:  Colin Benson is a 38 y.o. male with a history of exertional headaches. Returns today for follow-up.  He reports that his headaches have completely gone away.  No longer developing a headache after coughing or sneezing.  He states that he tapered himself off of Topamax  and propranolol .  Has not had any additional symptoms.  He never had MRI of the brain that was ordered originally by Dr. Billy Bue.  He did have CTA of the head that was unremarkable.  Also had a CT scan of the head that was unremarkable.    01/17/23: Colin Benson is a 38 y.o. male who has been followed in this office for exertional headaches. Returns today for follow-up.  At the last visit he was still having a cough or sneeze associated headaches but then had also developed migraine type headache.  Cough or sneeze- usually goes away quickly but if its multiple coughs or sneezes then headache will last longer.  Intractable headache: has a headache 2-3 times a week. Was daily. If severe will take imitrex . If not severe will usually resolve in a few hours.  Headache is typically located in the center of the forehead.  Reports photophobia but denies phonophobia.  Denies nausea or vomiting.  He is taking Topamax  25 mg at bedtime he does state that it causes tingling in the fingertips and carbonated beverages taste flat.  He is tolerating side effects okay for now.  Patient only has 1 kidney.  Not a good candidate for NSAIDs  HISTORY:    11/10/22:   Colin Benson is a 38 y.o. male with a history of exertional headaches. Returns today for follow-up.  He reports that his headaches have worsened and he has developed a different type of headache.  Initially was getting headaches only when he coughs or sneezed. However, now he is getting a headache without those triggers. Cough related headaches are in the top center of the head.now will get a stabbing headache between the eyes.these headaches can last for hours.  He states that if they are severe he will try Imitrex  but not sure how much it helps.  He does state that Imitrex  makes him feel a little weird.  He also reports that he will get headaches in the temporal region.  These feel like a  burning sensation. These headaches only last for minutes.  An MRI was ordered but this was denied by his insurance company.  He had a CTA and a CT scan of the head that was unremarkable.  His headaches are typically not associated with nausea or vomiting.  He does report some phonophobia with his headaches.  He denies lacrimation or nasal congestion with his headaches.   HPI:  Medical co-morbidities: single kidney (kidney donor)   The patient presents for evaluation of headaches which began 2 months ago. They are triggered by coughing, sneezing, and bending forward. Feels it has been getting worse over time. He will have an immediate  burst of pain which lasts for 1-2 minutes, then has a dull headache which lasts for the rest of the day. Headaches are not associated with photophobia, phonophobia, or nausea.    [facility] 08/01/22 was unremarkable.    Headache History: Onset: 2 months ago Triggers: coughing, sneezing, bending forward Aura: none Location: vertex, forehead Associated Symptoms:             Photophobia: no             Phonophobia: no             Nausea: no Worse with activity?: no Duration of headaches: 1-2 minutes of severe pain followed by several hours of dull headache   Current  Treatment: Abortive Tylenol    Preventative none   Prior Therapies                                 Tylenol      LABS: 09/29/21: CMP with Cr 1.34, otherwise wnl   IMAGING:  [facility] 08/01/22: unremarkable    REVIEW OF SYSTEMS: Out of a complete 14 system review of symptoms, the patient complains only of the following symptoms, and all other reviewed systems are negative.  ALLERGIES: Allergies  Allergen Reactions   Azithromycin Swelling     Swelling:  Mouth, throat   Penicillins Hives and Other (See Comments)    Reaction: fever    HOME MEDICATIONS: Outpatient Medications Prior to Visit  Medication Sig Dispense Refill   Acetaminophen  (TYLENOL  PO) Take by mouth. Occasionally     amphetamine-dextroamphetamine (ADDERALL XR) 25 MG 24 hr capsule Take 25 mg by mouth every morning.     propranolol  (INDERAL ) 40 MG tablet TAKE 1 TABLET BY MOUTH TWICE A DAY (Patient not taking: Reported on 09/14/2023) 180 tablet 0   SUMAtriptan  (IMITREX ) 50 MG tablet Take 1 tablet (50 mg total) by mouth as needed for headache. May repeat in 2 hours if headache persists or recurs. Max dose 2 pills in 24 hours (Patient not taking: Reported on 09/14/2023) 10 tablet 11   topiramate  (TOPAMAX ) 50 MG tablet Take 1 tablet (50 mg total) by mouth at bedtime. (Patient not taking: Reported on 09/14/2023) 30 tablet 11   HYDROcodone -acetaminophen  (NORCO/VICODIN) 5-325 MG per tablet Take 1-2 tablets by mouth every 4 (four) hours as needed for moderate pain. (Patient not taking: Reported on 09/14/2023) 30 tablet 0   ibuprofen (ADVIL,MOTRIN) 200 MG tablet Take 200 mg by mouth every 6 (six) hours as needed. (Patient not taking: Reported on 09/14/2023)     Facility-Administered Medications Prior to Visit  Medication Dose Route Frequency Provider Last Rate Last Admin   TDaP (BOOSTRIX) injection 0.5 mL  0.5 mL Intramuscular Once Nelson, Sarah E, PA-C        PAST MEDICAL HISTORY: Past Medical History:  Diagnosis Date   Headache      PAST SURGICAL HISTORY: History reviewed. No pertinent surgical history.  FAMILY HISTORY: Family History  Problem Relation Age of Onset   Migraines Neg Hx     SOCIAL HISTORY: Social History   Socioeconomic History   Marital status: Married    Spouse name: Not on file   Number of children: Not on file   Years of education: Not on file   Highest education level: Not on file  Occupational History   Not on file  Tobacco Use   Smoking status: Former    Current packs/day: 0.00  Types: Cigarettes    Quit date: 08/07/2005    Years since quitting: 18.1   Smokeless tobacco: Never  Vaping Use   Vaping status: Never Used  Substance and Sexual Activity   Alcohol use: Yes    Alcohol/week: 0.0 - 2.0 standard drinks of alcohol   Drug use: Never   Sexual activity: Not on file  Other Topics Concern   Not on file  Social History Narrative   Right Handed   2 cups of Caffeine per day   Social Drivers of Health   Financial Resource Strain: Not on file  Food Insecurity: Not on file  Transportation Needs: Not on file  Physical Activity: Not on file  Stress: Not on file  Social Connections: Not on file  Intimate Partner Violence: Not on file      PHYSICAL EXAM  Vitals:   09/14/23 1042  BP: 125/84  Pulse: 74  Weight: 235 lb (106.6 kg)  Height: 6\' 1"  (1.854 m)   Body mass index is 31 kg/m.  Generalized: Well developed, in no acute distress   Neurological examination  Mentation: Alert oriented to time, place, history taking. Follows all commands speech and language fluent Cranial nerve II-XII: Pupils were equal round reactive to light. Extraocular movements were full, visual field were full on confrontational test. Facial sensation and strength were normal.  Head turning and shoulder shrug  were normal and symmetric. Motor: The motor testing reveals 5 over 5 strength of all 4 extremities. Good symmetric motor tone is noted throughout.  Sensory: Sensory testing is  intact to soft touch on all 4 extremities. No evidence of extinction is noted.  Coordination: Cerebellar testing reveals good finger-nose-finger and heel-to-shin bilaterally.  Gait and station: Gait is normal. Tandem gait is normal. Romberg is negative. No drift is seen.  Reflexes: Deep tendon reflexes are symmetric and normal bilaterally.   DIAGNOSTIC DATA (LABS, IMAGING, TESTING) - I reviewed patient records, labs, notes, testing and imaging myself where available.  Lab Results  Component Value Date   WBC 10.4 (A) 07/08/2013   HGB 16.0 07/08/2013   HCT 48.1 07/08/2013   MCV 89.4 07/08/2013   PLT 385 05/09/2008      Component Value Date/Time   NA 138 05/09/2008 0720   K 3.7 05/09/2008 0720   CL 104 05/09/2008 0720   CO2 27 05/09/2008 0720   GLUCOSE 118 (H) 05/09/2008 0720   BUN 19 05/09/2008 0720   CREATININE 1.08 05/09/2008 0720   CALCIUM 9.5 05/09/2008 0720   PROT 7.0 05/09/2008 0720   ALBUMIN 4.1 05/09/2008 0720   AST 24 05/09/2008 0720   ALT 30 05/09/2008 0720   ALKPHOS 77 05/09/2008 0720   BILITOT 0.7 05/09/2008 0720   GFRNONAA >60 05/09/2008 0720   GFRAA  05/09/2008 0720    >60        The eGFR has been calculated using the MDRD equation. This calculation has not been validated in all clinical       ASSESSMENT AND PLAN 38 y.o. year old male  has a past medical history of Headache. here with:  Intractable headache- Migraine Primary cough headache  - Headaches have completely resolved.  Patient is no longer on any medication.  Advised that if his headaches return he should let us  know.  Otherwise he will follow-up with our office on an as-needed basis.      Clem Currier, MSN, NP-C 09/14/2023, 10:47 AM [facility] [address], [address] [place], [place] [address] [phone])  [phone]
